# Patient Record
Sex: Male | Born: 1955 | Race: White | Hispanic: No
Health system: Southern US, Community
[De-identification: ages and names within clinical notes are randomized; demographics above are authoritative.]

---

## 2010-06-20 ENCOUNTER — Ambulatory Visit: Payer: Self-pay | Admitting: Family Medicine

## 2010-09-06 ENCOUNTER — Ambulatory Visit: Payer: Self-pay | Admitting: Family Medicine

## 2010-12-01 ENCOUNTER — Other Ambulatory Visit: Payer: Self-pay | Admitting: Family Medicine

## 2011-08-25 ENCOUNTER — Other Ambulatory Visit: Payer: Self-pay

## 2011-08-25 LAB — URINALYSIS, COMPLETE
Bacteria: NONE SEEN
Bilirubin,UR: NEGATIVE
Blood: NEGATIVE
Leukocyte Esterase: NEGATIVE
Nitrite: NEGATIVE
Ph: 5 (ref 4.5–8.0)
Protein: 30
Squamous Epithelial: NONE SEEN
WBC UR: 1 /HPF (ref 0–5)

## 2011-08-27 LAB — URINE CULTURE

## 2011-08-29 ENCOUNTER — Ambulatory Visit: Payer: Self-pay | Admitting: Internal Medicine

## 2011-08-31 ENCOUNTER — Inpatient Hospital Stay: Payer: Self-pay | Admitting: *Deleted

## 2011-08-31 LAB — COMPREHENSIVE METABOLIC PANEL
Albumin: 2.5 g/dL — ABNORMAL LOW (ref 3.4–5.0)
Alkaline Phosphatase: 69 U/L (ref 50–136)
BUN: 32 mg/dL — ABNORMAL HIGH (ref 7–18)
Bilirubin,Total: 0.4 mg/dL (ref 0.2–1.0)
Calcium, Total: 8.5 mg/dL (ref 8.5–10.1)
Creatinine: 1.16 mg/dL (ref 0.60–1.30)
EGFR (African American): >60
EGFR (Non-African Amer.): >60
Glucose: 119 mg/dL — ABNORMAL HIGH (ref 65–99)
Osmolality: 297 (ref 275–301)
SGOT(AST): 28 U/L (ref 15–37)
Sodium: 145 mmol/L (ref 136–145)

## 2011-08-31 LAB — CBC
HGB: 12.5 g/dL — ABNORMAL LOW (ref 13.0–18.0)
MCH: 32.9 pg (ref 26.0–34.0)
Platelet: 154 10*3/uL (ref 150–440)
RBC: 3.81 10*6/uL — ABNORMAL LOW (ref 4.40–5.90)
RDW: 13.2 % (ref 11.5–14.5)
WBC: 4.9 10*3/uL (ref 3.8–10.6)

## 2011-08-31 LAB — URINALYSIS, COMPLETE
Bacteria: NONE SEEN
Blood: NEGATIVE
Glucose,UR: NEGATIVE mg/dL (ref 0–75)
Leukocyte Esterase: NEGATIVE
Nitrite: NEGATIVE
Protein: 100
RBC,UR: 21 /HPF (ref 0–5)
Specific Gravity: 1.038 (ref 1.003–1.030)
WBC UR: 4 /HPF (ref 0–5)

## 2011-08-31 LAB — TROPONIN I: Troponin-I: 0.03 ng/mL

## 2011-09-01 LAB — BASIC METABOLIC PANEL
Anion Gap: 13 (ref 7–16)
Calcium, Total: 7.7 mg/dL — ABNORMAL LOW (ref 8.5–10.1)
Co2: 23 mmol/L (ref 21–32)
EGFR (African American): >60
Osmolality: 292 (ref 275–301)
Sodium: 143 mmol/L (ref 136–145)

## 2011-09-01 LAB — CBC WITH DIFFERENTIAL/PLATELET
Basophil #: 0 10*3/uL (ref 0.0–0.1)
Basophil %: 0.3 %
Eosinophil %: 0.1 %
HCT: 28.3 % — ABNORMAL LOW (ref 40.0–52.0)
Lymphocyte %: 9 %
MCHC: 34 g/dL (ref 32.0–36.0)
Monocyte %: 17.7 %
Neutrophil #: 3.2 10*3/uL (ref 1.4–6.5)
RDW: 13.2 % (ref 11.5–14.5)
WBC: 4.3 10*3/uL (ref 3.8–10.6)

## 2011-09-02 LAB — CBC WITH DIFFERENTIAL/PLATELET
Basophil #: 0 10*3/uL (ref 0.0–0.1)
HCT: 26 % — ABNORMAL LOW (ref 40.0–52.0)
HGB: 8.9 g/dL — ABNORMAL LOW (ref 13.0–18.0)
Lymphocyte %: 8.8 %
Monocyte %: 11.3 %
Neutrophil #: 4.9 10*3/uL (ref 1.4–6.5)
Platelet: 126 10*3/uL — ABNORMAL LOW (ref 150–440)
RDW: 13.5 % (ref 11.5–14.5)
WBC: 6.2 10*3/uL (ref 3.8–10.6)

## 2011-09-02 LAB — BASIC METABOLIC PANEL
Calcium, Total: 7.7 mg/dL — ABNORMAL LOW (ref 8.5–10.1)
Co2: 25 mmol/L (ref 21–32)
EGFR (African American): >60
Glucose: 84 mg/dL (ref 65–99)
Potassium: 3 mmol/L — ABNORMAL LOW (ref 3.5–5.1)
Sodium: 142 mmol/L (ref 136–145)

## 2011-09-02 LAB — URINE CULTURE

## 2011-09-02 LAB — VANCOMYCIN, TROUGH: Vancomycin, Trough: 14 ug/mL (ref 10–20)

## 2011-09-03 LAB — BASIC METABOLIC PANEL
Calcium, Total: 7.8 mg/dL — ABNORMAL LOW (ref 8.5–10.1)
Co2: 28 mmol/L (ref 21–32)
EGFR (African American): >60
Sodium: 140 mmol/L (ref 136–145)

## 2011-09-04 LAB — POTASSIUM: Potassium: 3.6 mmol/L (ref 3.5–5.1)

## 2011-09-04 LAB — VANCOMYCIN, TROUGH: Vancomycin, Trough: 16 ug/mL (ref 10–20)

## 2011-09-06 LAB — CULTURE, BLOOD (SINGLE)

## 2011-09-29 ENCOUNTER — Ambulatory Visit: Payer: Self-pay | Admitting: Internal Medicine

## 2012-08-12 ENCOUNTER — Other Ambulatory Visit: Payer: Self-pay | Admitting: Family Medicine

## 2012-08-12 LAB — URINALYSIS, COMPLETE
Bacteria: NONE SEEN
Blood: NEGATIVE
Glucose,UR: NEGATIVE mg/dL (ref 0–75)
Leukocyte Esterase: NEGATIVE
Nitrite: NEGATIVE
Protein: NEGATIVE
RBC,UR: <1 /HPF (ref 0–5)
WBC UR: 1 /HPF (ref 0–5)

## 2013-01-26 ENCOUNTER — Other Ambulatory Visit: Payer: Self-pay | Admitting: Family Medicine

## 2013-01-26 LAB — CBC WITH DIFFERENTIAL/PLATELET
Basophil #: 0 10*3/uL (ref 0.0–0.1)
Basophil %: 0.3 %
Eosinophil #: 0.1 10*3/uL (ref 0.0–0.7)
HCT: 36.4 % — ABNORMAL LOW (ref 40.0–52.0)
HGB: 13 g/dL (ref 13.0–18.0)
Lymphocyte #: 1.1 10*3/uL (ref 1.0–3.6)
Lymphocyte %: 19.7 %
Monocyte #: 0.4 x10 3/mm (ref 0.2–1.0)
Monocyte %: 7.3 %
Neutrophil %: 70.7 %
Platelet: 102 10*3/uL — ABNORMAL LOW (ref 150–440)
RDW: 13.8 % (ref 11.5–14.5)
WBC: 5.7 10*3/uL (ref 3.8–10.6)

## 2013-01-26 LAB — COMPREHENSIVE METABOLIC PANEL
Alkaline Phosphatase: 152 U/L — ABNORMAL HIGH (ref 50–136)
Anion Gap: 6 — ABNORMAL LOW (ref 7–16)
Calcium, Total: 8.9 mg/dL (ref 8.5–10.1)
Chloride: 109 mmol/L — ABNORMAL HIGH (ref 98–107)
Creatinine: 0.86 mg/dL (ref 0.60–1.30)
Glucose: 149 mg/dL — ABNORMAL HIGH (ref 65–99)
Osmolality: 295 (ref 275–301)
Potassium: 3.9 mmol/L (ref 3.5–5.1)
Total Protein: 6.4 g/dL (ref 6.4–8.2)

## 2013-02-09 ENCOUNTER — Inpatient Hospital Stay: Payer: Self-pay | Admitting: Internal Medicine

## 2013-02-09 LAB — URINALYSIS, COMPLETE
Glucose,UR: NEGATIVE mg/dL (ref 0–75)
Hyaline Cast: 20
Nitrite: NEGATIVE
Protein: 30
RBC,UR: 22 /HPF (ref 0–5)
Specific Gravity: 1.035 (ref 1.003–1.030)
Squamous Epithelial: 1

## 2013-02-09 LAB — COMPREHENSIVE METABOLIC PANEL
Alkaline Phosphatase: 145 U/L — ABNORMAL HIGH (ref 50–136)
Anion Gap: 9 (ref 7–16)
BUN: 42 mg/dL — ABNORMAL HIGH (ref 7–18)
Calcium, Total: 9.1 mg/dL (ref 8.5–10.1)
Chloride: 107 mmol/L (ref 98–107)
Co2: 25 mmol/L (ref 21–32)
Creatinine: 1 mg/dL (ref 0.60–1.30)
EGFR (Non-African Amer.): >60
Glucose: 110 mg/dL — ABNORMAL HIGH (ref 65–99)
Osmolality: 292 (ref 275–301)
Potassium: 4.3 mmol/L (ref 3.5–5.1)
SGPT (ALT): 27 U/L (ref 12–78)
Sodium: 141 mmol/L (ref 136–145)
Total Protein: 7.2 g/dL (ref 6.4–8.2)

## 2013-02-09 LAB — CBC
HGB: 15.6 g/dL (ref 13.0–18.0)
MCH: 33.4 pg (ref 26.0–34.0)
MCHC: 35.1 g/dL (ref 32.0–36.0)
MCV: 95 fL (ref 80–100)
RBC: 4.68 10*6/uL (ref 4.40–5.90)
RDW: 13.9 % (ref 11.5–14.5)
WBC: 18.9 10*3/uL — ABNORMAL HIGH (ref 3.8–10.6)

## 2013-02-10 LAB — BASIC METABOLIC PANEL
Calcium, Total: 8.3 mg/dL — ABNORMAL LOW (ref 8.5–10.1)
Chloride: 111 mmol/L — ABNORMAL HIGH (ref 98–107)
EGFR (African American): >60
Glucose: 74 mg/dL (ref 65–99)
Potassium: 3.5 mmol/L (ref 3.5–5.1)
Sodium: 143 mmol/L (ref 136–145)

## 2013-02-10 LAB — CBC WITH DIFFERENTIAL/PLATELET
Eosinophil %: 1.3 %
Lymphocyte #: 1.1 10*3/uL (ref 1.0–3.6)
Lymphocyte %: 17.5 %
MCH: 33.6 pg (ref 26.0–34.0)
MCV: 95 fL (ref 80–100)
Monocyte #: 0.5 x10 3/mm (ref 0.2–1.0)
Monocyte %: 7.4 %
Neutrophil %: 73.4 %
Platelet: 100 10*3/uL — ABNORMAL LOW (ref 150–440)
RDW: 13.7 % (ref 11.5–14.5)

## 2013-02-14 LAB — CULTURE, BLOOD (SINGLE)

## 2013-03-02 ENCOUNTER — Other Ambulatory Visit: Payer: Self-pay | Admitting: Family Medicine

## 2013-03-02 ENCOUNTER — Inpatient Hospital Stay: Payer: Self-pay | Admitting: Internal Medicine

## 2013-03-02 LAB — CBC
HCT: 41.7 % (ref 40.0–52.0)
HGB: 14.5 g/dL (ref 13.0–18.0)
MCH: 33 pg (ref 26.0–34.0)
MCHC: 34.6 g/dL (ref 32.0–36.0)
RDW: 14.1 % (ref 11.5–14.5)

## 2013-03-02 LAB — VALPROIC ACID LEVEL: Valproic Acid: 60 ug/mL

## 2013-03-02 LAB — COMPREHENSIVE METABOLIC PANEL
Albumin: 3.4 g/dL (ref 3.4–5.0)
Anion Gap: 9 (ref 7–16)
BUN: 40 mg/dL — ABNORMAL HIGH (ref 7–18)
Chloride: 108 mmol/L — ABNORMAL HIGH (ref 98–107)
EGFR (African American): >60
EGFR (Non-African Amer.): >60
Glucose: 90 mg/dL (ref 65–99)
Osmolality: 289 (ref 275–301)
Potassium: 4.2 mmol/L (ref 3.5–5.1)
Total Protein: 7.8 g/dL (ref 6.4–8.2)

## 2013-03-02 LAB — TROPONIN I: Troponin-I: <0.02 ng/mL

## 2013-03-02 LAB — CK TOTAL AND CKMB (NOT AT ARMC)
CK, Total: 116 U/L (ref 35–232)
CK-MB: 0.8 ng/mL (ref 0.5–3.6)

## 2013-03-03 LAB — COMPREHENSIVE METABOLIC PANEL
Albumin: 2.4 g/dL — ABNORMAL LOW (ref 3.4–5.0)
Anion Gap: 7 (ref 7–16)
BUN: 43 mg/dL — ABNORMAL HIGH (ref 7–18)
Bilirubin,Total: 0.7 mg/dL (ref 0.2–1.0)
Calcium, Total: 8.9 mg/dL (ref 8.5–10.1)
Chloride: 108 mmol/L — ABNORMAL HIGH (ref 98–107)
EGFR (African American): >60
EGFR (Non-African Amer.): >60
Osmolality: 294 (ref 275–301)
Potassium: 3.6 mmol/L (ref 3.5–5.1)
SGOT(AST): 22 U/L (ref 15–37)
Sodium: 142 mmol/L (ref 136–145)
Total Protein: 6.5 g/dL (ref 6.4–8.2)

## 2013-03-03 LAB — CBC WITH DIFFERENTIAL/PLATELET
Basophil #: 0 10*3/uL (ref 0.0–0.1)
Eosinophil #: 0 10*3/uL (ref 0.0–0.7)
HCT: 36.9 % — ABNORMAL LOW (ref 40.0–52.0)
Lymphocyte #: 0.9 10*3/uL — ABNORMAL LOW (ref 1.0–3.6)
Lymphocyte %: 7.7 %
MCHC: 35.4 g/dL (ref 32.0–36.0)
MCV: 94 fL (ref 80–100)
Monocyte #: 1.3 x10 3/mm — ABNORMAL HIGH (ref 0.2–1.0)
Monocyte %: 10.5 %
Neutrophil %: 81.5 %
RBC: 3.92 10*6/uL — ABNORMAL LOW (ref 4.40–5.90)

## 2013-03-03 LAB — MAGNESIUM: Magnesium: 1.8 mg/dL

## 2013-03-07 LAB — CULTURE, BLOOD (SINGLE)

## 2013-07-27 ENCOUNTER — Other Ambulatory Visit: Payer: Self-pay | Admitting: Family Medicine

## 2013-07-27 LAB — PHENYTOIN LEVEL, TOTAL: Dilantin: 22.8 ug/mL — ABNORMAL HIGH (ref 10.0–20.0)

## 2013-07-27 LAB — COMPREHENSIVE METABOLIC PANEL
ALBUMIN: 2.8 g/dL — AB (ref 3.4–5.0)
ALK PHOS: 181 U/L — AB
ANION GAP: 4 — AB (ref 7–16)
BILIRUBIN TOTAL: 0.2 mg/dL (ref 0.2–1.0)
BUN: 23 mg/dL — AB (ref 7–18)
Calcium, Total: 9.1 mg/dL (ref 8.5–10.1)
Chloride: 106 mmol/L (ref 98–107)
Co2: 30 mmol/L (ref 21–32)
Creatinine: 0.42 mg/dL — ABNORMAL LOW (ref 0.60–1.30)
GLUCOSE: 77 mg/dL (ref 65–99)
Osmolality: 282 (ref 275–301)
POTASSIUM: 4.6 mmol/L (ref 3.5–5.1)
SGOT(AST): 34 U/L (ref 15–37)
SGPT (ALT): 29 U/L (ref 12–78)
SODIUM: 140 mmol/L (ref 136–145)
Total Protein: 6.6 g/dL (ref 6.4–8.2)

## 2013-07-27 LAB — CBC WITH DIFFERENTIAL/PLATELET
BASOS PCT: 0.2 %
Basophil #: 0 10*3/uL (ref 0.0–0.1)
EOS ABS: 0.2 10*3/uL (ref 0.0–0.7)
EOS PCT: 1.8 %
HCT: 38.2 % — ABNORMAL LOW (ref 40.0–52.0)
HGB: 12.8 g/dL — AB (ref 13.0–18.0)
LYMPHS PCT: 10.1 %
Lymphocyte #: 0.9 10*3/uL — ABNORMAL LOW (ref 1.0–3.6)
MCH: 32.5 pg (ref 26.0–34.0)
MCHC: 33.6 g/dL (ref 32.0–36.0)
MCV: 97 fL (ref 80–100)
Monocyte #: 0.4 x10 3/mm (ref 0.2–1.0)
Monocyte %: 4.6 %
Neutrophil #: 7.3 10*3/uL — ABNORMAL HIGH (ref 1.4–6.5)
Neutrophil %: 83.3 %
Platelet: 81 10*3/uL — ABNORMAL LOW (ref 150–440)
RBC: 3.94 10*6/uL — ABNORMAL LOW (ref 4.40–5.90)
RDW: 14.2 % (ref 11.5–14.5)
WBC: 8.8 10*3/uL (ref 3.8–10.6)

## 2013-07-27 LAB — LIPID PANEL
Cholesterol: 206 mg/dL — ABNORMAL HIGH (ref 0–200)
HDL Cholesterol: 101 mg/dL — ABNORMAL HIGH (ref 40–60)
Ldl Cholesterol, Calc: 94 mg/dL (ref 0–100)
Triglycerides: 56 mg/dL (ref 0–200)
VLDL CHOLESTEROL, CALC: 11 mg/dL (ref 5–40)

## 2013-07-27 LAB — VALPROIC ACID LEVEL: VALPROIC ACID: 32 ug/mL — AB

## 2013-08-05 ENCOUNTER — Other Ambulatory Visit: Payer: Self-pay | Admitting: Family Medicine

## 2013-08-05 LAB — CBC WITH DIFFERENTIAL/PLATELET
BASOS PCT: 0.2 %
Basophil #: 0 10*3/uL (ref 0.0–0.1)
EOS ABS: 0.1 10*3/uL (ref 0.0–0.7)
Eosinophil %: 0.8 %
HCT: 36.5 % — AB (ref 40.0–52.0)
HGB: 12.5 g/dL — AB (ref 13.0–18.0)
LYMPHS ABS: 0.6 10*3/uL — AB (ref 1.0–3.6)
Lymphocyte %: 7.8 %
MCH: 33 pg (ref 26.0–34.0)
MCHC: 34.1 g/dL (ref 32.0–36.0)
MCV: 97 fL (ref 80–100)
MONOS PCT: 6.1 %
Monocyte #: 0.5 x10 3/mm (ref 0.2–1.0)
NEUTROS ABS: 6.7 10*3/uL — AB (ref 1.4–6.5)
Neutrophil %: 85.1 %
Platelet: 159 10*3/uL (ref 150–440)
RBC: 3.78 10*6/uL — ABNORMAL LOW (ref 4.40–5.90)
RDW: 15 % — ABNORMAL HIGH (ref 11.5–14.5)
WBC: 7.8 10*3/uL (ref 3.8–10.6)

## 2013-08-05 LAB — COMPREHENSIVE METABOLIC PANEL
ALBUMIN: 2.8 g/dL — AB (ref 3.4–5.0)
ALT: 25 U/L (ref 12–78)
ANION GAP: 2 — AB (ref 7–16)
Alkaline Phosphatase: 179 U/L — ABNORMAL HIGH
BUN: 35 mg/dL — ABNORMAL HIGH (ref 7–18)
Bilirubin,Total: 0.3 mg/dL (ref 0.2–1.0)
Calcium, Total: 9.2 mg/dL (ref 8.5–10.1)
Chloride: 104 mmol/L (ref 98–107)
Co2: 33 mmol/L — ABNORMAL HIGH (ref 21–32)
Creatinine: 0.8 mg/dL (ref 0.60–1.30)
Glucose: 117 mg/dL — ABNORMAL HIGH (ref 65–99)
Osmolality: 287 (ref 275–301)
Potassium: 3.8 mmol/L (ref 3.5–5.1)
SGOT(AST): 29 U/L (ref 15–37)
SODIUM: 139 mmol/L (ref 136–145)
Total Protein: 7 g/dL (ref 6.4–8.2)

## 2013-08-21 ENCOUNTER — Inpatient Hospital Stay: Payer: Self-pay | Admitting: Internal Medicine

## 2013-08-21 LAB — URINALYSIS, COMPLETE
BACTERIA: NONE SEEN
BILIRUBIN, UR: NEGATIVE
Blood: NEGATIVE
Glucose,UR: NEGATIVE mg/dL (ref 0–75)
KETONE: NEGATIVE
Nitrite: NEGATIVE
Ph: 5 (ref 4.5–8.0)
Protein: NEGATIVE
RBC,UR: <1 /HPF (ref 0–5)
Specific Gravity: 1.026 (ref 1.003–1.030)
Squamous Epithelial: NONE SEEN
WBC UR: 4 /HPF (ref 0–5)

## 2013-08-21 LAB — CBC
HCT: 36.3 % — AB (ref 40.0–52.0)
HGB: 12.5 g/dL — AB (ref 13.0–18.0)
MCH: 34.4 pg — ABNORMAL HIGH (ref 26.0–34.0)
MCHC: 34.4 g/dL (ref 32.0–36.0)
MCV: 100 fL (ref 80–100)
Platelet: 195 10*3/uL (ref 150–440)
RBC: 3.63 10*6/uL — AB (ref 4.40–5.90)
RDW: 17.9 % — ABNORMAL HIGH (ref 11.5–14.5)
WBC: 7.3 10*3/uL (ref 3.8–10.6)

## 2013-08-21 LAB — TROPONIN I: Troponin-I: <0.02 ng/mL

## 2013-08-21 LAB — COMPREHENSIVE METABOLIC PANEL
ALT: 22 U/L (ref 12–78)
ANION GAP: 4 — AB (ref 7–16)
Albumin: 2.8 g/dL — ABNORMAL LOW (ref 3.4–5.0)
Alkaline Phosphatase: 171 U/L — ABNORMAL HIGH
BUN: 24 mg/dL — ABNORMAL HIGH (ref 7–18)
Bilirubin,Total: 0.3 mg/dL (ref 0.2–1.0)
CREATININE: 0.82 mg/dL (ref 0.60–1.30)
Calcium, Total: 9 mg/dL (ref 8.5–10.1)
Chloride: 103 mmol/L (ref 98–107)
Co2: 31 mmol/L (ref 21–32)
EGFR (African American): >60
GLUCOSE: 108 mg/dL — AB (ref 65–99)
OSMOLALITY: 280 (ref 275–301)
Potassium: 4.3 mmol/L (ref 3.5–5.1)
SGOT(AST): 26 U/L (ref 15–37)
Sodium: 138 mmol/L (ref 136–145)
Total Protein: 7.2 g/dL (ref 6.4–8.2)

## 2013-08-21 LAB — CLOSTRIDIUM DIFFICILE(ARMC)

## 2013-08-21 LAB — LIPASE, BLOOD: LIPASE: 163 U/L (ref 73–393)

## 2013-08-21 LAB — VALPROIC ACID LEVEL: Valproic Acid: 30 ug/mL — ABNORMAL LOW

## 2013-08-21 LAB — PHENYTOIN LEVEL, TOTAL: Dilantin: 10.4 ug/mL (ref 10.0–20.0)

## 2013-08-22 ENCOUNTER — Ambulatory Visit: Payer: Self-pay | Admitting: Internal Medicine

## 2013-08-22 LAB — BASIC METABOLIC PANEL
Anion Gap: 3 — ABNORMAL LOW (ref 7–16)
BUN: 18 mg/dL (ref 7–18)
CALCIUM: 8.5 mg/dL (ref 8.5–10.1)
CO2: 29 mmol/L (ref 21–32)
Chloride: 107 mmol/L (ref 98–107)
Creatinine: 0.49 mg/dL — ABNORMAL LOW (ref 0.60–1.30)
EGFR (African American): >60
EGFR (Non-African Amer.): >60
Glucose: 72 mg/dL (ref 65–99)
Osmolality: 278 (ref 275–301)
Potassium: 3.9 mmol/L (ref 3.5–5.1)
SODIUM: 139 mmol/L (ref 136–145)

## 2013-08-22 LAB — CBC WITH DIFFERENTIAL/PLATELET
Basophil #: 0 10*3/uL (ref 0.0–0.1)
Basophil %: 0.3 %
Eosinophil #: 0 10*3/uL (ref 0.0–0.7)
Eosinophil %: 0.7 %
HCT: 29.4 % — AB (ref 40.0–52.0)
HGB: 10 g/dL — ABNORMAL LOW (ref 13.0–18.0)
Lymphocyte #: 0.9 10*3/uL — ABNORMAL LOW (ref 1.0–3.6)
Lymphocyte %: 12.7 %
MCH: 34.1 pg — ABNORMAL HIGH (ref 26.0–34.0)
MCHC: 34.1 g/dL (ref 32.0–36.0)
MCV: 100 fL (ref 80–100)
MONO ABS: 0.4 x10 3/mm (ref 0.2–1.0)
Monocyte %: 5.9 %
NEUTROS ABS: 5.9 10*3/uL (ref 1.4–6.5)
Neutrophil %: 80.4 %
Platelet: 146 10*3/uL — ABNORMAL LOW (ref 150–440)
RBC: 2.94 10*6/uL — ABNORMAL LOW (ref 4.40–5.90)
RDW: 17.1 % — ABNORMAL HIGH (ref 11.5–14.5)
WBC: 7.3 10*3/uL (ref 3.8–10.6)

## 2013-08-23 LAB — WBCS, STOOL

## 2013-08-25 LAB — STOOL CULTURE

## 2013-08-26 LAB — CULTURE, BLOOD (SINGLE)

## 2013-08-28 ENCOUNTER — Ambulatory Visit: Payer: Self-pay | Admitting: Internal Medicine

## 2013-10-02 ENCOUNTER — Ambulatory Visit: Payer: Self-pay | Admitting: Family Medicine

## 2013-10-02 LAB — URINALYSIS, COMPLETE
BLOOD: NEGATIVE
Bacteria: NONE SEEN
Bilirubin,UR: NEGATIVE
GLUCOSE, UR: NEGATIVE mg/dL (ref 0–75)
Ketone: NEGATIVE
Leukocyte Esterase: NEGATIVE
NITRITE: NEGATIVE
PH: 7 (ref 4.5–8.0)
PROTEIN: NEGATIVE
RBC,UR: 1 /HPF (ref 0–5)
Specific Gravity: 1.011 (ref 1.003–1.030)
Squamous Epithelial: 1
WBC UR: 2 /HPF (ref 0–5)

## 2013-10-03 LAB — URINE CULTURE

## 2013-11-28 DEATH — deceased

## 2014-05-07 IMAGING — CR DG HUMERUS 2V *L*
1 series · 2 of 2 positions shown · non-contrast
Comparison: Prior chest x-ray 08/21/2013 and 03/02/2013

CLINICAL DATA: Pain, deformity

EXAM:
LEFT HUMERUS - 2+ VIEW

[Series 1: ap · 0.17mm/px · 2 of 2 slices shown]
[im 1/2]
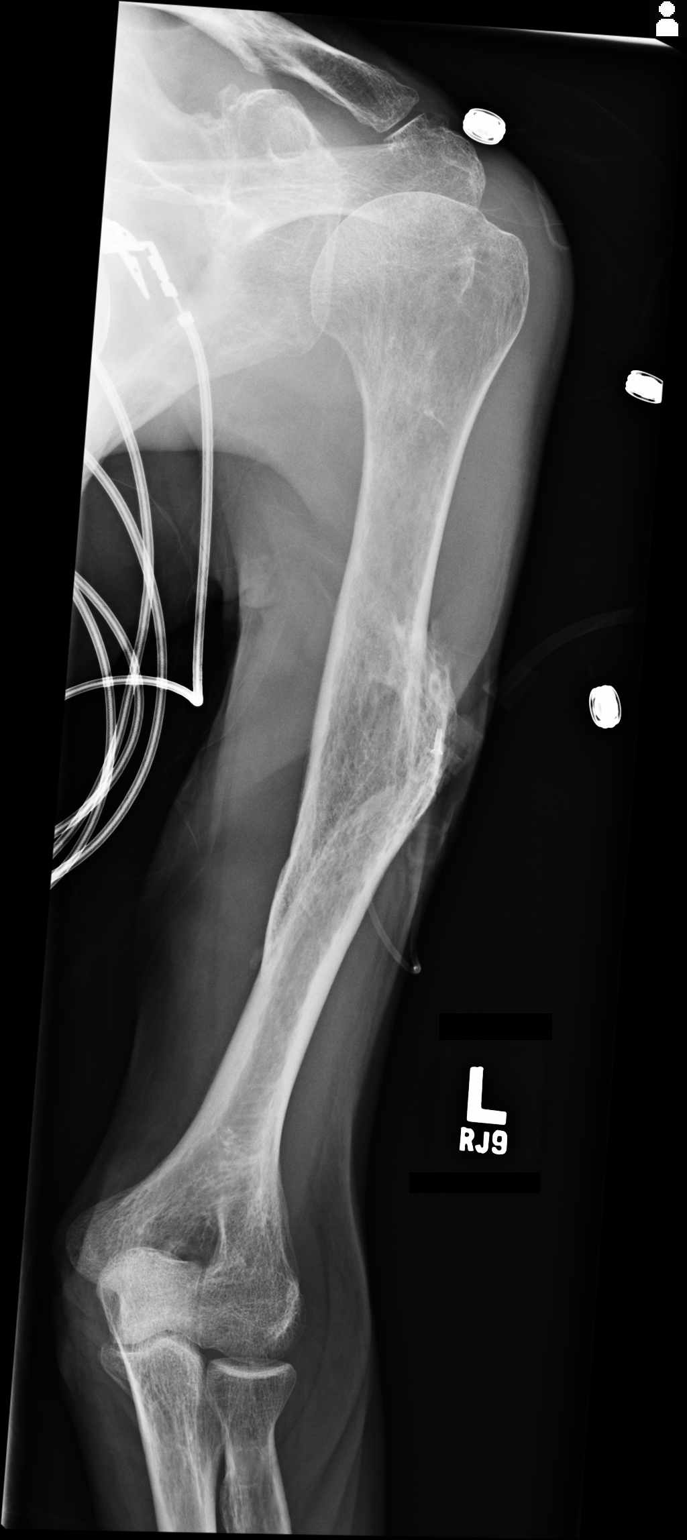
[im 2/2]
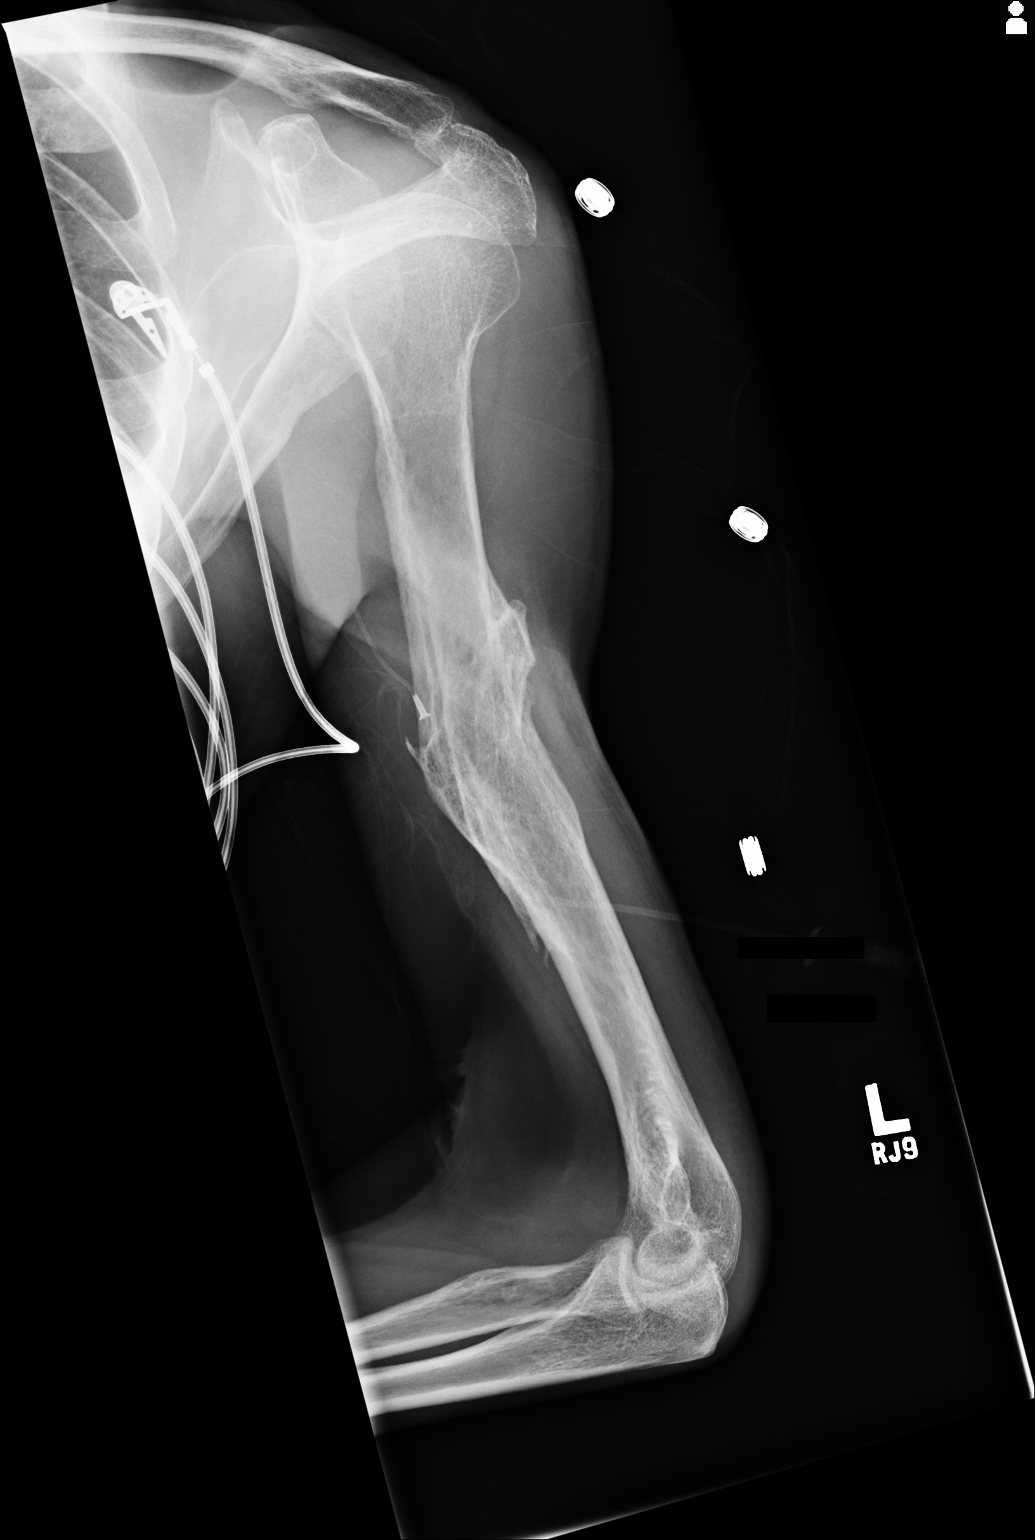

[2 of 2 positions shown; findings below may reference images not displayed]

FINDINGS: Healed mid humeral diaphyseal fracture with persistent posttraumatic
deformity. Mildly exuberant callus. The visualized portions of the
shoulder and elbow joint are within normal limits. No acute soft
tissue abnormality. No lytic or blastic osseous lesion.
IMPRESSION: Healed mid humeral diaphyseal fracture with residual posttraumatic
deformity.
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
## 2014-10-20 NOTE — Consult Note (Signed)
Chief Complaint:  Subjective/Chief Complaint Nonverbal.   VITAL SIGNS/ANCILLARY NOTES: **Vital Signs.:   14-Aug-14 07:45  Vital Signs Type Routine  Temperature Temperature (F) 97.1  Celsius 36.1  Temperature Source axillary  Pulse Pulse 88  Respirations Respirations 18  Systolic BP Systolic BP 599  Diastolic BP (mmHg) Diastolic BP (mmHg) 66  Mean BP 77  Pulse Ox % Pulse Ox % 94  Pulse Ox Activity Level  At rest  Oxygen Delivery Room Air/ 21 %   Brief Assessment:  GEN no acute distress   Cardiac Regular   Respiratory clear BS   Gastrointestinal Normal  nondistended. nontender   Lab Results: Routine Chem:  14-Aug-14 05:35   Glucose, Serum 74  BUN  36  Creatinine (comp) 0.77  Sodium, Serum 143  Potassium, Serum 3.5  Chloride, Serum  111  CO2, Serum 27  Calcium (Total), Serum  8.3  Anion Gap  5  Osmolality (calc) 292  eGFR (African American) >60  eGFR (Non-African American) >60 (eGFR values <27m/min/1.73 m2 may be an indication of chronic kidney disease (CKD). Calculated eGFR is useful in patients with stable renal function. The eGFR calculation will not be reliable in acutely ill patients when serum creatinine is changing rapidly. It is not useful in  patients on dialysis. The eGFR calculation may not be applicable to patients at the low and high extremes of body sizes, pregnant women, and vegetarians.)  Routine Hem:  14-Aug-14 05:35   WBC (CBC) 6.2  RBC (CBC)  3.23  Hemoglobin (CBC)  10.8  Hematocrit (CBC)  30.5  Platelet Count (CBC)  100  MCV 95  MCH 33.6  MCHC 35.5  RDW 13.7  Neutrophil % 73.4  Lymphocyte % 17.5  Monocyte % 7.4  Eosinophil % 1.3  Basophil % 0.4  Neutrophil # 4.6  Lymphocyte # 1.1  Monocyte # 0.5  Eosinophil # 0.1  Basophil # 0.0 (Result(s) reported on 10 Feb 2013 at 07:37AM.)   Radiology Results: XRay:    14-Aug-14 08:22, Abdomen Flat and Erect  Abdomen Flat and Erect   REASON FOR EXAM:    Constipation.  Follow-up after  laxative therapy.  See   CT scan findings of abdome  COMMENTS:       PROCEDURE: DXR - DXR ABDOMEN 2 V FLAT AND ERECT  - Feb 10 2013  8:22AM     RESULT: Comparisons:  None    Findings:      Supine andupright views of the abdomen are provided.    There is a nonspecific bowel gas pattern. There is no bowel dilatation to   suggest obstruction. There is a large amount stool in the rectum. There   are no air-fluid levels. There is no pathologic calcification along the     expected course of the ureters. There is no evidence of pneumoperitoneum,   portal venous gas, or pneumatosis.    The osseous structures are unremarkable.    IMPRESSION:     Rectal fecal impaction.    Dictation Site: 1        Verified By: HJennette Banker M.D., MD   Assessment/Plan:  Assessment/Plan:  Assessment Ileus. Fecal impaction. Responded well to enemas.   Plan Recommend reg stool softeners/ enemas prn. Sxs likely to recur in the future. Thanks   Electronic Signatures: OVerdie Shire(MD)  (Signed 14-Aug-14 14:20)  Authored: Chief Complaint, VITAL SIGNS/ANCILLARY NOTES, Brief Assessment, Lab Results, Radiology Results, Assessment/Plan   Last Updated: 14-Aug-14 14:20 by OVerdie Shire(MD)

## 2014-10-20 NOTE — Consult Note (Signed)
PATIENT NAME:  Riley BurowCOX, Golden L MR#:  [id number] DATE OF BIRTH:  January 01, 1956  DATE OF CONSULTATION:  02/09/2013  CONSULTING PHYSICIAN:  Rodman Keyawn S. Lyanne Kates, NP  ADDENDUM:   The patient's presentation was discussed with Dr. Lutricia FeilPaul Oh following my evaluation with the patient earlier this afternoon. He did receive Fleet's enema as well as Dulcolax suppository as prescribed. In conversation with nursing staff, the patient did have a very large bowel movement, noted relief of distention of abdomen.  State the patient actually was smiling afterwards. We will continue to monitor the patient during hospitalization. Do not feel further GI evaluation is warranted immediately, but we will proceed with a flat and upright of abdomen tomorrow. Depending on findings, the patient may need to receive additional laxative therapy at this point but do feel that regimen needs to be adjusted prior to being discharged, and this will be addressed at that time.   These services provided by Rodman Keyawn S. Tony Granquist, MS, APRN, [facility], FNP, under collaborative agreement with Lutricia FeilPaul Oh, MD.       ____________________________ Rodman Keyawn S. Emir Nack, NP dsh:cb D: 02/09/2013 17:49:30 ET T: 02/09/2013 20:11:45 ET JOB#: [id number]  cc: Rodman Keyawn S. Mardel Grudzien, NP, <Dictator> Rodman KeyAWN S Madaline Lefeber MD ELECTRONICALLY SIGNED 02/10/2013 15:29

## 2014-10-20 NOTE — Discharge Summary (Signed)
PATIENT NAME:  Riley Burns, Riley Burns MR#:  [id number] DATE OF BIRTH:  [birth year]  DATE OF ADMISSION:  02/09/2013 DATE OF DISCHARGE:  02/11/2013   PRESENTING COMPLAINT: Lethargy, high fever, not acting right.   DISCHARGE DIAGNOSES:  1. Systemic inflammatory response syndrome secondary to early bilateral pneumonia. Possibly aspiration. The patient on dysphagia with honey-thick liquid diet.  2. Severe constipation/severe ileus, resolved.  3. History of chronic constipation.  4. Leukocytosis.  5. Relative hypotension in the setting of systemic inflammatory response syndrome. Will hold off blood pressure medications.  6. Chronic dysphagia, status post modified barium swallow. Recommend pureed diet with honey-thick liquids.   LABORATORY DATA: White count is 6.2, H and H is 10.8 and 30.5.  Glucose is 74, BUN 36, creatinine 0.77, sodium 143, potassium 3.5. Blood cultures negative in 48 hours. CT of the abdomen and pelvis with contrast showed dilated loops of large bowel containing air and stool. There is also dilation with stool and air in the rectosigmoid portion of the colon. Atelectasis versus infiltrate in the left lung base as well as small pulmonary nodule.  UA positive for UTI. Urine culture negative.  Lactic acid 3.40.  White count on admission was 18,000.  EKG: Sinus tachycardia.   DIET: Regular dysphagia 1 pureed diet with honey-thick liquids.   MEDICATIONS:  1. Tylenol 650 p.o. q.4 p.r.n.  2. Aspirin 81 mg daily.  3. Levaquin 500 p.o. daily for 5 more days.  4. Dilantin 200 mg at bedtime and 100 mg in the morning.  5. Depakote 500 mg b.i.d.  6. Celexa 10 mg daily.  7. Remeron 15 mg at bedtime.  8. Fexofenadine 180 mg p.o. daily.  9. Allegra 180 mg p.o. daily.  10. Zocor 20 mg daily.  11. Baclofen 10 mg t.i.d.  12. Folic acid 0.8 mg daily.  13. Multivitamin 1 tablet daily.  14. Senokot 2 tabs orally at bedtime.  15. Colace 100 mg b.i.d.   CODE STATUS: No code, DNR.    CONSULTATIONS: Dr. Bluford Kaufmann, GI, and speech therapy.   BRIEF SUMMARY OF HOSPITAL COURSE: Riley Burns is a 59 year old Caucasian gentleman who was brought in from [facility] with altered mental status and fever. He was admitted with:   1. Systemic inflammatory response syndrome secondary to bilateral early pneumonia, more on the left than right, possibly aspiration. He was started on IV Zosyn and Zithromax and has been changed to p.o. Levaquin to finish a course of 7 days. The patient was on dysphagia pureed diet with honey-thick liquid at the skilled facility, was resumed after a modified barium swallow was done by speech therapy, and recommendations were given to continue the same time diet. Blood cultures remain negative. White count normalized from 18,000 to 6.8.  2. Severe constipation/severe ileus with colonic obstruction in a patient with history of chronic constipation. The patient had several large bowel movements after 2 days of Fleet's and Dulcolax. Daily stool softeners were recommended by GI.  3. Leukocytosis, resolved.  4. Relative hypotension in the setting of sepsis, improved, remains stable. The patient's blood pressure stays on the lower side in the 90s to 100s systolic.  5. Deep vein thrombosis prophylaxis with heparin subcutaneous t.i.d.   CODE STATUS: No code. Is a DNR.   Discharge plan was discussed with the patient's sister, Riley Burns.   TIME SPENT: 40 minutes.   ____________________________ Wylie Hail Allena Katz, MD sap:OSi D: 02/11/2013 08:50:35 ET T: 02/11/2013 09:26:46 ET JOB#: [id number]  cc: Cordarrell Sane A. Allena Katz, MD, <Dictator>  Willow OraSONA A Tayvian Holycross MD ELECTRONICALLY SIGNED 02/18/2013 5:44
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
## 2014-10-20 NOTE — Discharge Summary (Signed)
ADDENDUM  PATIENT NAME:  Riley Burns, Riley Burns MR#:  [id number] DATE OF BIRTH:  02-26-1956  DATE OF ADMISSION:  02/09/2013 DATE OF DISCHARGE:    CODE STATUS: Limited code. The patient has the MOST form in the chart.  ____________________________ Jearl KlinefelterSona A. Allena KatzPatel, MD sap:aw D: 02/11/2013 09:06:20 ET T: 02/11/2013 09:16:33 ET JOB#: [id number]  cc: Khaniyah Bezek A. Allena KatzPatel, MD, <Dictator> Willow OraSONA A Nikcole Eischeid MD ELECTRONICALLY SIGNED 02/18/2013 5:44

## 2014-10-20 NOTE — Consult Note (Signed)
Please see Riley Burns's notes. Pt had a large BM after receiving laxatives. Will repeat x-ray in AM and see if there is improvement.    Electronic Signatures: Lutricia Feilh, Cam Harnden (MD)  (Signed on 13-Aug-14 20:34)  Authored  Last Updated: 13-Aug-14 20:34 by Lutricia Feilh, Chong Wojdyla (MD)

## 2014-10-20 NOTE — Discharge Summary (Signed)
PATIENT NAME:  Riley Burns, Riley Burns MR#:  [id number] DATE OF BIRTH:  [birth year]  DATE OF ADMISSION:  03/02/2013 DATE OF DISCHARGE:  03/04/2013   DISPOSITION: Transfer to [facility].   DISCHARGE DIAGNOSES:  1. Aspiration without pneumonia.  2. Dysphagia.  3. Malnutrition.  4. Chronic thrombocytopenia.   IMAGING STUDIES DONE: Include a chest x-ray which showed right lower lobe atelectasis.   ADMITTING HISTORY AND PHYSICAL: Please see detailed H and P dictated previously by Dr. Mordecai MaesSanchez. In brief, a 59 year old patient with traumatic brain injury, dementia, who is bedbound, with chronic thrombocytopenia and dysphagia, who presented to the hospital after the patient was noticed to have a choking episode. Then, his saturations dropped to 70s and 80s and presented to the Emergency Room and was admitted to hospitalist service.   HOSPITAL COURSE:  1. Aspiration. The patient has had episodes of aspiration in the past. He is on modified honey-thickened liquids and pureed diet, with which he has been doing well, with some episodes of choking, but as his saturations dropped, was sent to the ER. The patient had right lower lobe atelectasis. No pneumonia. No leukocytosis or fever. Initially, started on Zosyn, which has been stopped. The patient is doing well. Is saturating 96% on room air by the day of discharge and will be discharged back to [facility] nursing home. I did discuss with the patient's sister, who is his health care power of attorney, regarding PEG tube, but she has decided against it secondary to poor improvement in quality of life. This was also discussed by his primary care physician and palliative care in the past.  2. Chronic thrombocytopenia, stable.  3. The patient was seen by speech therapy for swallow evaluation, and the patient will be on a honey-thickened liquids with pureed diet with aspiration precautions. These have been put in his discharge instructions in detail.   Today on  examination, the patient is nonverbal, but awake and alert.   DISCHARGE MEDICATIONS: Include:  1. Aspirin 81 mg oral once a day.  2. Multivitamin 1 tablet oral once a day. 3. Senokot 8.6 mg 2 tablets oral once a day at bedtime.  4. Folic acid 0.8 mg once a day.  5. Citalopram 10 mg oral once a day.  6. Dilantin 100 mg oral 2 capsules at bedtime and 1 capsule in the morning.  7. Depakote Sprinkles 125 mg 2 capsules oral 2 times a day.  8. Remeron 7.5 mg oral once a day at bedtime.  9. Colace 20 mL oral once a day.  10. Baclofen 10 mg oral 2 times a day.  The patient's Depakote dose has been reduced at the request of his sister, who mentions that he does better when he does not take these medications. His primary care physician needs to assess medications and hopefully reduce the doses or stop one of these medications. I have also reduced the dose on the Remeron from 15 to 7.5 and stopped the simvastatin.   DISCHARGE INSTRUCTIONS: Regular diet with honey-thickened liquids and pureed diet. Aspiration precautions. Follow up with primary care physician in a week. Transfer to [facility]. Activity as tolerated.   TIME SPENT ON DAY OF DISCHARGE IN DISCHARGE ACTIVITY: 40 minutes.    ____________________________ Molinda BailiffSrikar R. Kaslyn Richburg, MD srs:OSi D: 03/04/2013 09:42:56 ET T: 03/04/2013 09:53:27 ET JOB#: [id number]  cc: Wardell HeathSrikar R. Caitlyn Buchanan, MD, <Dictator> Teena Iraniavid M. Terance HartBronstein, MD [facility] Wardell HeathSRIKAR [place] Judea Riches MD ELECTRONICALLY SIGNED 03/12/2013 19:28
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
## 2014-10-20 NOTE — H&P (Signed)
PATIENT NAME:  Schoeneck, Nickey L MR#:  [id number] DATE OF BIRTH:  06/08/Loleta Chance1957  ADDENDUM  Next problem #.  Thrombocytopenia. The patient has platelets of 108. We are going to start him on low-dose heparin for deep vein thrombosis prophylaxis. If platelets continue to drop, we will stop heparin.   Next #.  Monitor closely.    ____________________________ Felipa Furnaceoberto Sanchez Gutierrez, MD rsg:np D: 03/02/2013 17:46:02 ET T: 03/02/2013 19:09:02 ET JOB#: [id number]  cc: Felipa Furnaceoberto Sanchez Gutierrez, MD, <Dictator> Daryl Quiros Juanda ChanceSANCHEZ GUTIERRE MD ELECTRONICALLY SIGNED 03/11/2013 3:38
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
## 2014-10-21 NOTE — Consult Note (Signed)
Comments   I met with pt's sister and brother-in-law. Sister feels patient looks the best he has in recent memory, in fact, better than his baseline. She asks about the Hospice Home but this no longer seems appropriate although patient is at risk for recurrent aspiration and general decline. I recommended that pt return to the SNF where he can be followed by hospice and sister would agree with this plan. She ultimately wants to ensure his comfort and maintain the best quality of living possible. She has concern about anticonvulsants and sedating medications which she plans to speak with pt's PCP about stopping. We discussed pt's risk of recurrent aspiration and sister says she does not plan to send him back to the hospital and would likely opt for comfort instead. All questions answered.  20 minutes  Electronic Signatures for Addendum Section:  Phifer, Nancy (MD) (Signed Addendum 24-Feb-15 22:47)  Discussed with Josh Borders, NP, in detail. Agree with assessment and plan as outlined in above note.   Electronic Signatures: Borders, Joshua R (NP)  (Signed 24-Feb-15 13:12)  Authored: Palliative Care   Last Updated: 24-Feb-15 22:47 by Phifer, Nancy (MD) 

## 2014-10-21 NOTE — Discharge Summary (Signed)
PATIENT NAME:  Riley Burns, Riley Burns MR#:  [id number] DATE OF BIRTH:  [birth year]  DATE OF ADMISSION:  08/21/2013 DATE OF DISCHARGE:  08/23/2013  DISCHARGE DIAGNOSES: 1. Sepsis due to aspiration pneumonia.  2. 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  3. Brain injuries in the past.  4. Chronic dysphagia on pureed diet.   CONDITION ON DISCHARGE: Stable.   CODE STATUS: DO NOT RESUSCITATE.   MEDICATIONS ON DISCHARGE: 1. Multivitamin once a day.  2. Folic acid 0.8 once a day.  3. Zinc 220 mg oral tablet once a day.  4. Aspirin 81 mg once a day.  5. Docusate sodium 15 mL take 60 mL oral daily.  6. Senokot 2 tablets once a day.  7. Ascorbic acid 250 mg oral tablet, take two tablets once a day.  8. Albuterol one vial nebulizer 4 times a day as needed.  9. Dilantin 100 mg oral extended-release take two capsules once a day  10. Flagyl 250 mg oral tablet 3 times a day for eight days.  11. Augmentin 1 tablet orally every eight hours for six days.  DIET ON DISCHARGE: Regular. Advised to to have puree honey-thick liquids.  ACTIVITY: As tolerated.   TIMEFRAME TO FOLLOW-UP: Within 2 to 4 week, send to nursing home with hospice followups.   HISTORY OF PRESENTING ILLNESS: A 59 year old male with paraplegia was sent in for altered mental status. He was not able to give any history, so history obtained from his family. On chest x-ray he was found to have left lower lung air space disease. He also had some tachycardia and fever.   HOSPITAL COURSE AND STAY:  1. Clinical sepsis as presented with hypotension and some hyperthermia also. Given IV fluid and on broad-spectrum antibiotic, Zosyn, Levaquin and Zyvox for pneumonia.  paliative care  consult was also called in because of very severe condition and they talked to the patient's family and finally they agreed for hospice services at nursing home. The patient had significant improvement in his overall condition after just 2 days of IV antibiotic  therapy, and as per family he was back to his baseline and so suggested to discharge him to nursing home with hospice services.  2. C. difficile was positive, but toxins were negative, so he was a carrier, but because he presented with sepsis and was a nursing home resident, we suggested to  treat him with Flagyl orally.  3. Paraplegia and failure to thrive. He was malnourished, was n.p.o.  secondary to altered mental status  and speech evaluation was done and they started him on puree diet.   4. Acute encephalopathy likely secondary to sepsis and there was some chronic also because of brain damage in the past. Had significant improvement once his infection came under control with the next two days with antibiotic therapy.  5. C. diff as mentioned above, we will treat for C. difficile.   IMPORTANT LABORATORY RESULTS IN THIS HOSPITAL STAY:  1. Glucose 108, BUN 24, creatinine 0.82, sodium 138, potassium 4.3.  2. WBC 7.3, hemoglobin 12.5, platelet count 195.  3. Urinalysis is grossly negative. Blood culture remained negative. C. difficile testing was positive for antigen but negative for toxin. stool culture did not grow any salmonella or campylobacter.   TIME SPENT ON THIS DISCHARGE: 40 minutes.    ____________________________ Riley Burns Elisabeth Pigeon, MD vgv:sg D: 08/27/2013 22:04:50 ET T: 08/28/2013 09:00:48 ET JOB#: [id number]  cc: Riley Burns. Elisabeth Pigeon, MD, <Dictator> Dr. Bethena Midget Primary Care Physician  Altamese DillingVAIBHAVKUMAR Ethanjames Fontenot MD ELECTRONICALLY SIGNED 09/05/2013 7:51
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
## 2014-10-22 NOTE — Discharge Summary (Signed)
PATIENT NAME:  Riley Burns, Riley Burns MR#:  [id number] DATE OF BIRTH:  05/10/1956  DATE OF ADMISSION:  08/31/2011 DATE OF DISCHARGE:  09/05/2011  DISCHARGE DIAGNOSES:  1. Systemic inflammatory response syndrome secondary to aspiration pneumonia.  2. History of cerebrovascular accident with right hemiparesis. 3. Bedbound. 4. Seizure disorder. 5. Dysphagia on a puree diet. 6. Chronic thrombocytopenia.  7. Hyperlipidemia.  CONSULT: Palliative care.   HOSPITAL COURSE: A 59 year old male who is a resident of [facility]. He has history of cerebrovascular accident with right hemiparesis, seizure disorder, secondary thrombocytopenia, hyperlipidemia. He was sent because of fever and possible sepsis. He was being treated with Levaquin at [facility]. His fever was 103.5 when he came to the Emergency Room. His chest x-ray when he came in showed that the patient has bilateral lower lobe and left upper lobe pulmonary infiltrate. He was admitted as systemic inflammatory response syndrome secondary to pneumonia, most likely aspiration pneumonia. His white count was normal at admission, 4.9. He was tachycardic. He was febrile. He was hypoxic when he came in. He was also dehydrated with increased blood urea nitrogen. He was kept n.p.o. initially. He was given IV hydration. His BUN was 32, creatinine 1.16 when he came in. His blood cultures remained negative. His urinalysis when he came in essentially nitrite and leukocyte esterase negative. No evidence of pyuria. His urine culture was negative. His lactic acid when he came in was 2. EKG shows sinus tachycardia. He was started on vancomycin and Zosyn along with Zithromax. He had completed five days of Zithromax. He had completed five days of IV antibiotics here. Since his cultures are all negative, I am going to discharge him on linezolid and Augmentin to complete total of 10 days of antibiotics, five more days at the nursing home. His white count remained stable at 6.2.  He was seen by speech and he was started on a dysphagia diet. He is tolerating his dysphagia diet but his p.o. intake is borderline. Palliative care was consulted because of his poor general condition. Patient is always high risk for aspiration. Initially the sister was talking about artificial nutrition but then palliative care explained to her and then sister feels that patient would not benefit from this and it would not improve his quality of living so hospice services have been arranged at skilled nursing facility. His mental status: He is more alert but he is nonverbal. He has some minimal movement of his left side. Basically he is bedbound. His last creatinine on 03/06 was normal at 0.58. He has been hypokalemic and hypomagnesemic but that has been replaced and his potassium is 3.6 on 03/07 and magnesium of 2 on 03/07.   MEDICATIONS AT DISCHARGE/HOME MEDICATIONS:  1. Allegra 180 mg daily. 2. Asa 81 mg daily.  3. Citalopram 20 mg daily.  4. Dilantin 300 mg at bedtime and 100 mg in the morning.  5. Multivitamin daily.  6. Colace 100 mg b.i.d.  7. Baclofen 10 mg t.i.d.  8. Simvastatin 20 mg at bedtime.  9. Senokot-S 2 tablets at bedtime.  10. Acetaminophen 650 mg p.o. q.4 hours as needed.  11. Depakote sprinkles 125 mg p.o. once daily and  250 mg p.o. b.i.d. at 14:00 and 22:00 hours.  12. Folic acid 800 mcg p.o. once daily.   NEW MEDICATIONS:  1. Linezolid 600 mg p.o. b.i.d. for five days. 2. Augmentin 875 mg p.o. b.i.d. for five days.   DISCHARGE INSTRUCTIONS: His CBC should be monitored on linezolid. Will also  order home oxygen at 2 liters per minute. He can be weaned off oxygen. Diet instructions have been provided by speech therapist. Pureed with thickened liquids. Honey consistency liquids. Strict aspiration precautions. Medications crushed in puree. Feeding at all meals. Patient will be supported on his left side with pillows to prop his body midline, can also support under his head as  well. Small teaspoon amounts. Feed slowly. Allow for oral clearing. Alternate foods and liquids. Must be positioned bed upright for all oral intake.    CONDITION AT DISCHARGE: He has poor baseline status.  PHYSICAL EXAMINATION: VITAL SIGNS: T-max 97.7, heart rate 89, blood pressure 102/67, saturating 97% on room air. Chest is clear, diminished. Heart sounds are regular. Abdomen soft, nontender. He is nonverbal. Well hydrated at this time. He has right-sided hemiparesis, has some minimal movement of his left side, left lower extremity. He has swelling of his right upper extremity. He has some ecchymosis on his left upper extremity.   FOLLOW UP: Patient should follow up with Dr. Ter[facility]Bronstein at [facility]. Follow up CBC, BMP in 3 to 4 days. His CBC should be monitored on linezolid. Hospice at skilled nursing facility.   TIME SPENT WITH DISCHARGE: 50 minutes.   ____________________________ Fredia SorrowAbhinav Yamaira Spinner, MD ag:cms D: 09/05/2011 14:55:58 ET T: 09/05/2011 15:26:26 ET JOB#: [id number]  cc: Fredia SorrowAbhinav Brittany Amirault, MD, <Dictator> Teena Iraniavid M. Terance HartBronstein, MD Fredia SorrowABHINAV Gabe Glace MD ELECTRONICALLY SIGNED 09/10/2011 16:18
